# Patient Record
Sex: Female | Born: 1955 | Race: White | Hispanic: No | Marital: Married | Smoking: Former smoker
Health system: Southern US, Community
[De-identification: ages and names within clinical notes are randomized; demographics above are authoritative.]

---

## 2001-02-10 ENCOUNTER — Encounter: Payer: Self-pay | Admitting: Obstetrics and Gynecology

## 2001-02-10 ENCOUNTER — Ambulatory Visit (HOSPITAL_COMMUNITY): Admission: RE | Admit: 2001-02-10 | Discharge: 2001-02-10 | Payer: Self-pay | Admitting: Obstetrics and Gynecology

## 2001-02-17 ENCOUNTER — Other Ambulatory Visit: Admission: RE | Admit: 2001-02-17 | Discharge: 2001-02-17 | Payer: Self-pay | Admitting: Obstetrics and Gynecology

## 2002-02-21 ENCOUNTER — Ambulatory Visit (HOSPITAL_COMMUNITY): Admission: RE | Admit: 2002-02-21 | Discharge: 2002-02-21 | Payer: Self-pay | Admitting: Obstetrics and Gynecology

## 2002-02-21 ENCOUNTER — Encounter: Payer: Self-pay | Admitting: Obstetrics and Gynecology

## 2002-06-06 ENCOUNTER — Inpatient Hospital Stay (HOSPITAL_COMMUNITY): Admission: RE | Admit: 2002-06-06 | Discharge: 2002-06-07 | Payer: Self-pay | Admitting: Obstetrics and Gynecology

## 2003-05-10 ENCOUNTER — Ambulatory Visit (HOSPITAL_COMMUNITY): Admission: RE | Admit: 2003-05-10 | Discharge: 2003-05-10 | Payer: Self-pay | Admitting: Orthopedic Surgery

## 2003-06-19 ENCOUNTER — Encounter: Admission: RE | Admit: 2003-06-19 | Discharge: 2003-06-19 | Payer: Self-pay | Admitting: Orthopedic Surgery

## 2003-06-30 ENCOUNTER — Encounter: Admission: RE | Admit: 2003-06-30 | Discharge: 2003-06-30 | Payer: Self-pay | Admitting: Orthopedic Surgery

## 2003-07-17 ENCOUNTER — Encounter: Admission: RE | Admit: 2003-07-17 | Discharge: 2003-07-17 | Payer: Self-pay | Admitting: Orthopedic Surgery

## 2003-08-30 ENCOUNTER — Ambulatory Visit (HOSPITAL_COMMUNITY): Admission: RE | Admit: 2003-08-30 | Discharge: 2003-08-30 | Payer: Self-pay | Admitting: Obstetrics and Gynecology

## 2003-10-07 ENCOUNTER — Emergency Department (HOSPITAL_COMMUNITY): Admission: EM | Admit: 2003-10-07 | Discharge: 2003-10-07 | Payer: Self-pay | Admitting: Emergency Medicine

## 2005-01-07 ENCOUNTER — Ambulatory Visit (HOSPITAL_COMMUNITY): Admission: RE | Admit: 2005-01-07 | Discharge: 2005-01-07 | Payer: Self-pay | Admitting: Obstetrics and Gynecology

## 2006-06-15 ENCOUNTER — Ambulatory Visit (HOSPITAL_COMMUNITY): Admission: RE | Admit: 2006-06-15 | Discharge: 2006-06-15 | Payer: Self-pay | Admitting: Obstetrics and Gynecology

## 2006-11-20 ENCOUNTER — Ambulatory Visit (HOSPITAL_COMMUNITY): Admission: RE | Admit: 2006-11-20 | Discharge: 2006-11-20 | Payer: Self-pay | Admitting: Urology

## 2008-09-25 IMAGING — CT CT ABDOMEN WO/W CM
3 of 6 series · 12 of 46 positions shown, 17 images · IV contrast (Omnipaque 300)
Comparison: 10/07/2003.

ABDOMEN CT WITHOUT AND WITH CONTRAST

CLINICAL DATA: Microscopic hematuria. Low to mid pelvic pain since 4883. History
of partial hysterectomy.
TECHNIQUE: Multidetector CT imaging of the abdomen and pelvis was performed
following the standard protocol both before and during bolus administration of
intravenous contrast.

Contrast:  100 cc Omnipaque 300

[Series 4: mpr coronal a/p cor · coronal · 0.68mm/px · 3 of 87 slices shown]
[im 29/87  soft-tissue]
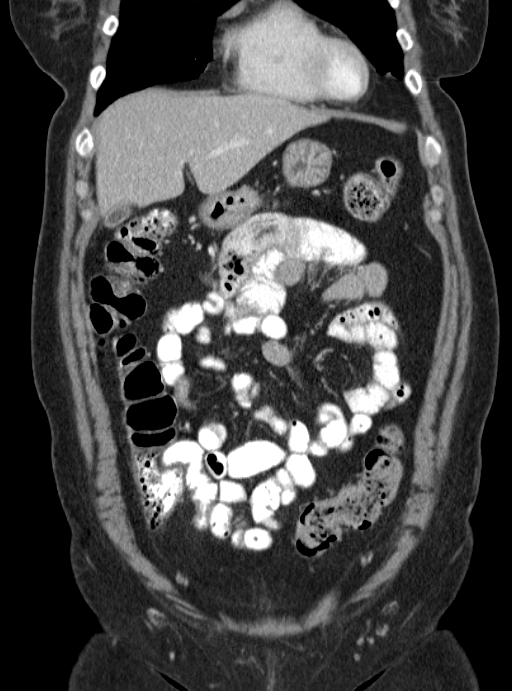
[im 39/87  soft-tissue]
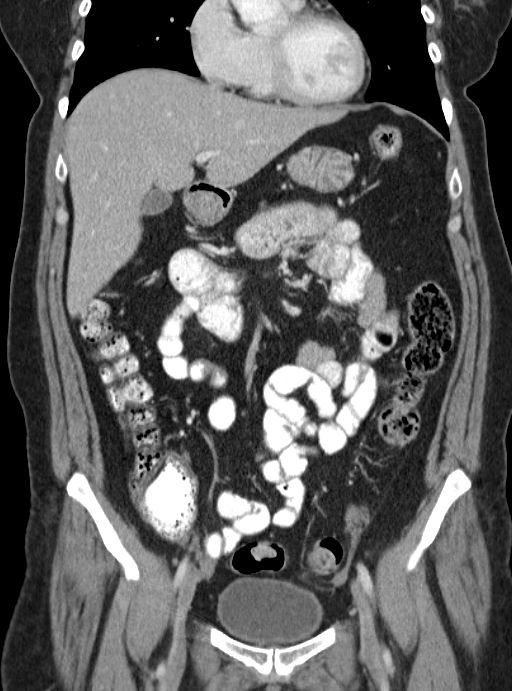
[im 48/87  soft-tissue]
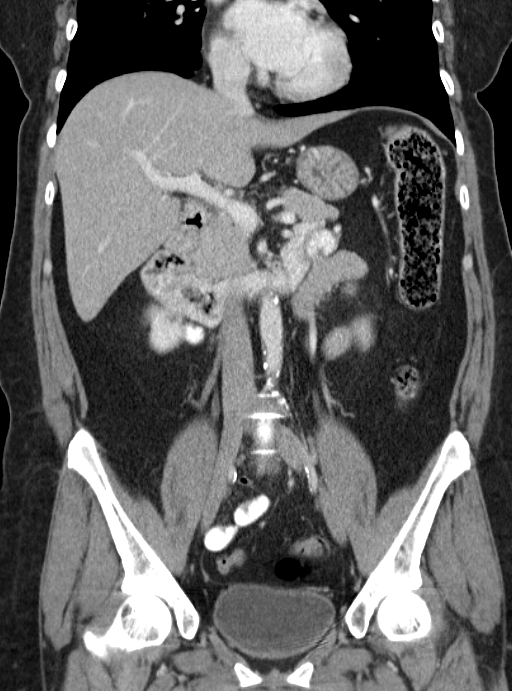

[Series 5: mpr sagittal a/p sag · sagittal · 0.66mm/px · 1 of 112 slices shown]
[im 38/112  soft-tissue]
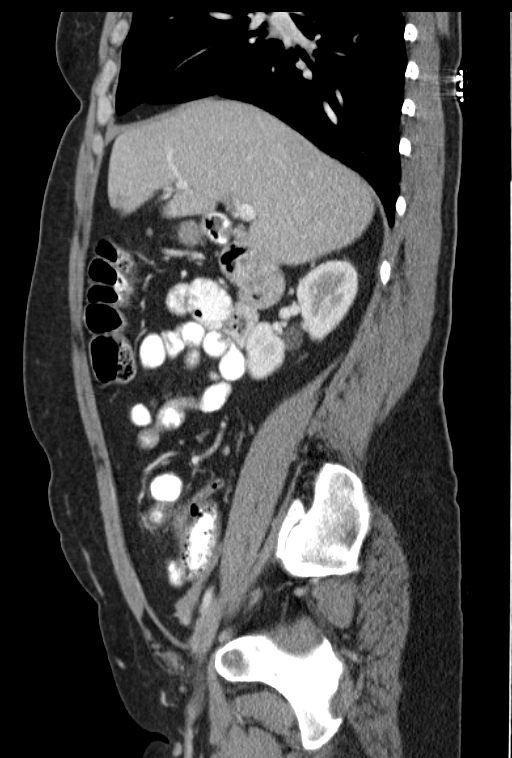

[Series 6: kidney delay 5.0 b40f · axial · delayed · 0.72mm/px · z∈[-426,-40]mm · 8 of 99 slices shown, 13 images]
[im 11/99  soft-tissue]
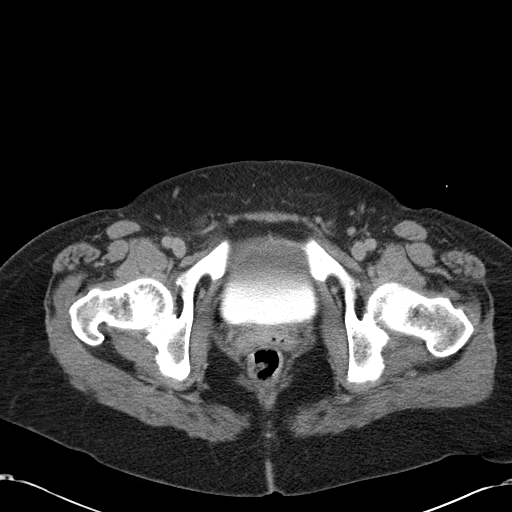
[im 11/99  bone]
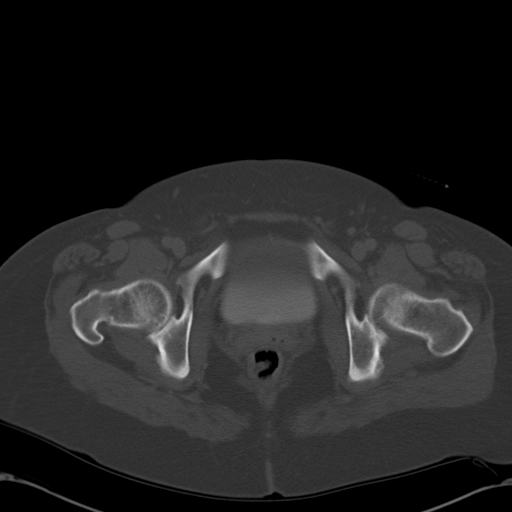
[im 22/99  soft-tissue]
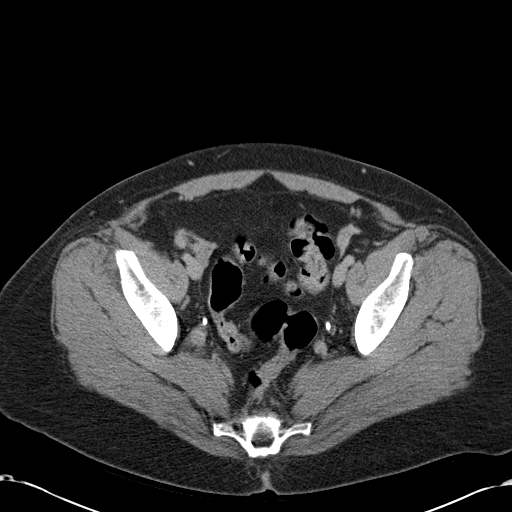
[im 33/99  soft-tissue]
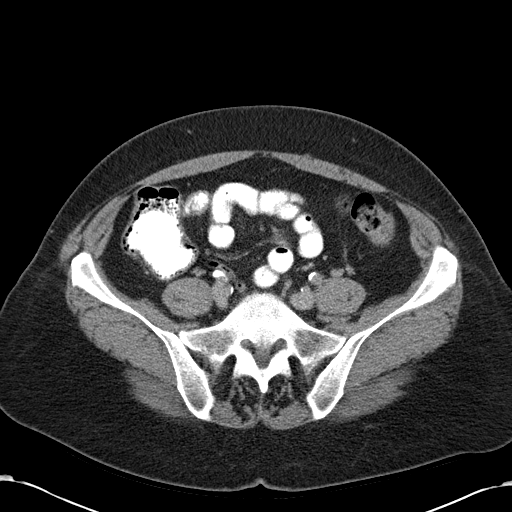
[im 44/99  soft-tissue]
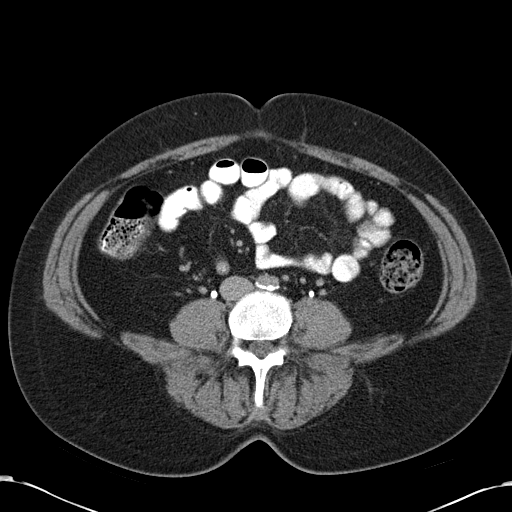
[im 55/99  soft-tissue]
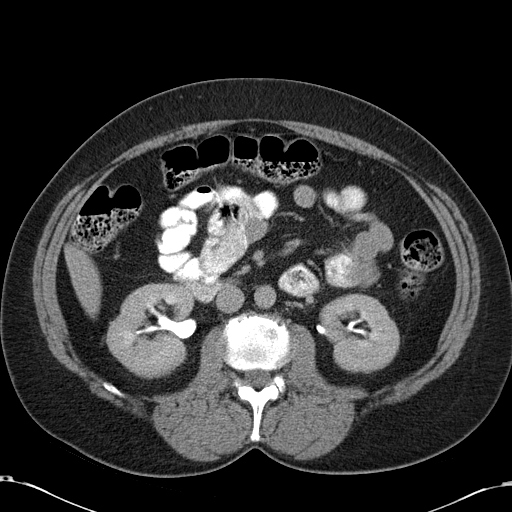
[im 55/99  lung]
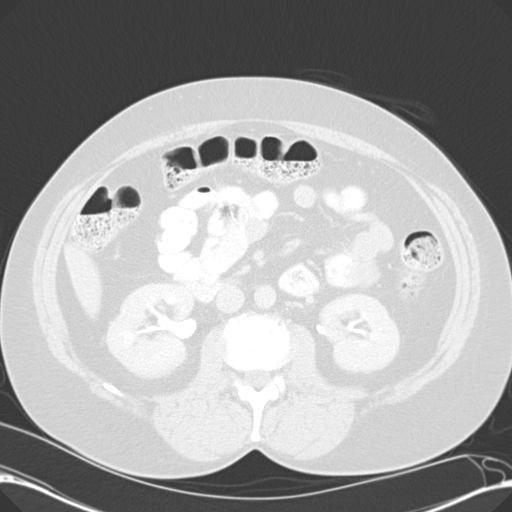
[im 66/99  soft-tissue]
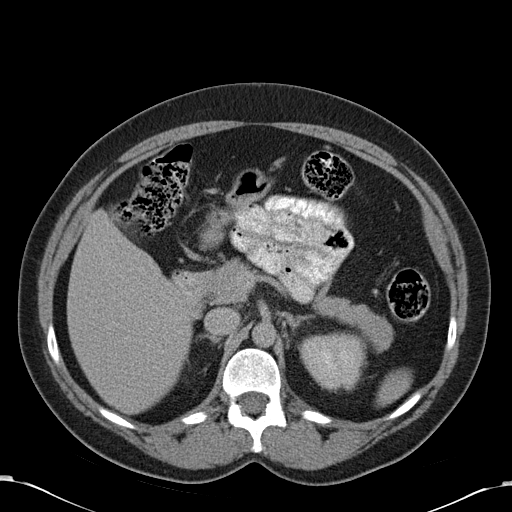
[im 66/99  lung]
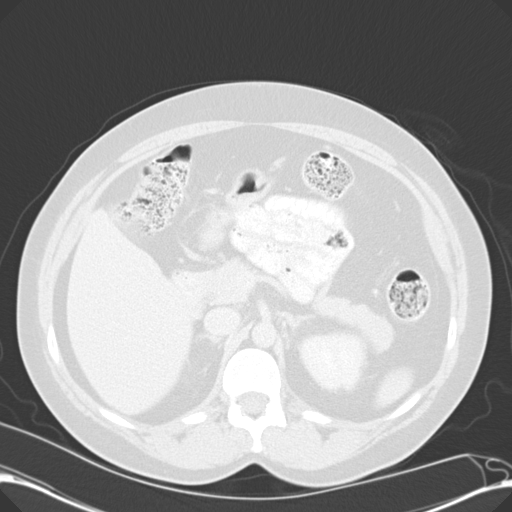
[im 77/99  soft-tissue]
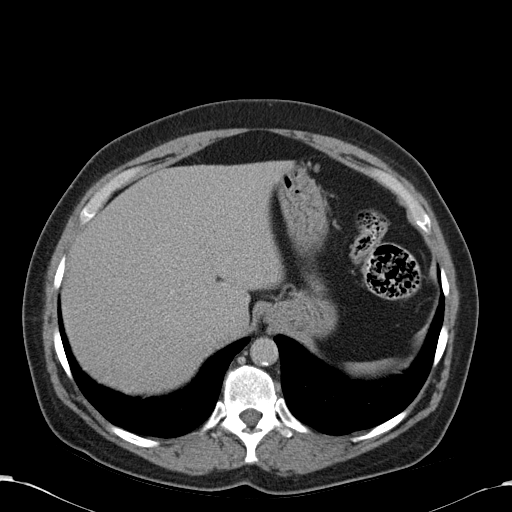
[im 77/99  lung]
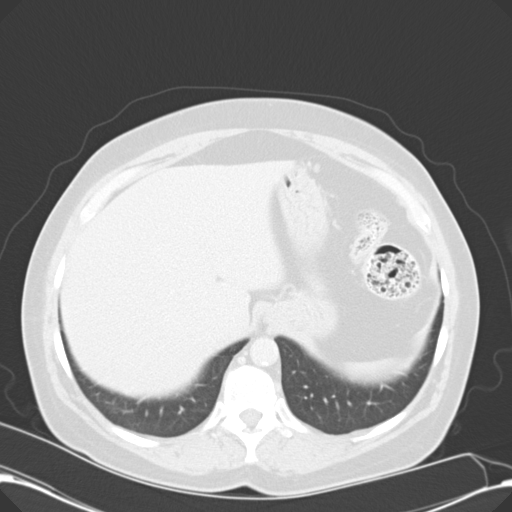
[im 88/99  soft-tissue]
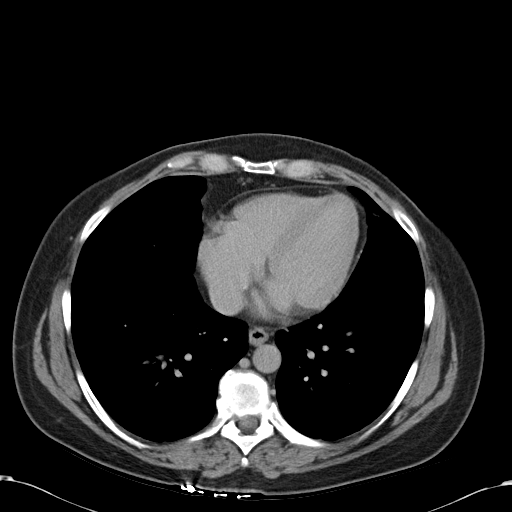
[im 88/99  lung]
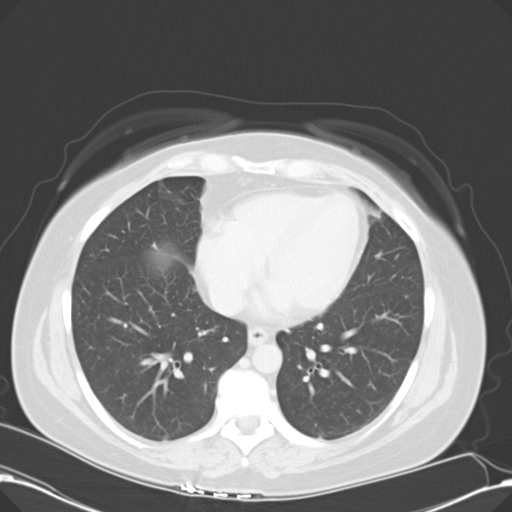

[12 of 46 positions shown; findings below may reference images not displayed]

FINDINGS: Unenhanced images demonstrates right inter/lower pole 5 mm calculus
which is similar to on the prior exam. A upper pole smaller calculus on the
right has passed. Probable 1 mm left upper pole renal calculus on image 35.

Postcontrast images demonstrate subpleural 4-5 mm right middle lobe lung nodule
on image 9 which is not imaged on prior exams. Pleural irregularity versus
subpleural nodule at 2 mm in right lower lobe on image 12.

Normal heart size without pericardial or pleural effusion. Normal liver, spleen,
stomach, pancreas, gallbladder, adrenal glands. Right lower pole too small to
characterize renal lesion is most likely a cyst . mild age advanced aortic
atherosclerosis. No retroperitoneal or retrocrural adenopathy.

Normal colon and appendix. Normal small bowel without ascites.

IMPRESSION

1. Right and likely left nonobstructive renal calculi. A right-sided stone has
passed since 10/07/2003.
[DATE]. Lung nodules up to 5 mm as described. If there is a smoking history, chest CT
followup in approximately 9-12 months should be considered.

PELVIS CT WITHOUT AND WITH CONTRAST
FINDINGS: No distal urinary tract calculi. Pelvic bowel loops are normal. No
pelvic adenopathy or ascites.

Hysterectomy. Normal urinary bladder and vaginal cuff. Ovaries are both
positioned superiorly and anteriorly but there is no adnexal or ovarian mass.
Normal bones.

IMPRESSION

1. Hysterectomy but no acute pelvic process.

## 2010-10-25 NOTE — Discharge Summary (Signed)
   NAME:  Amy Ashley, Amy Ashley                          ACCOUNT NO.:  [id number]   MEDICAL RECORD NO.:  [id number]                   PATIENT TYPE:  INP   LOCATION:  A419                                 FACILITY:  [facility]   PHYSICIAN:  Tilda Burrow, M.D.              DATE OF BIRTH:  02/08/1956   DATE OF ADMISSION:  06/06/2002  DATE OF DISCHARGE:  06/07/2002                                 DISCHARGE SUMMARY   PREOPERATIVE DIAGNOSES:  1. Menometrorrhagia.  2. Dysmenorrhea.   ADMISSION DIAGNOSES:  1. Menometrorrhagia.  2. Dysmenorrhea.   POSTOPERATIVE DIAGNOSES:  1. Menometrorrhagia.  2. Dysmenorrhea.   PROCEDURE:  Vaginal hysterectomy on 06/06/02.   DISCHARGE MEDICATIONS:  1. Tylox one to two q.4h. p.r.n. pain, dispensed 20.  2. Laxative of choice p.o. daily for two weeks.  3. Diazepam 10 mg one p.o. daily in partial doses.  4. Doxycycline 100 mg b.i.d. for seven days.   HISTORY OF PRESENT ILLNESS:  This 55 year old female status post tubal  ligation, gravida 2, para 2, was admitted for vaginal hysterectomy and was  counseled as described in the admitting history; see the record for details.  Blood type was O+. Quantitative HCG negative. Hemoglobin was 12.7,  hematocrit 37.2, white count 6,400. Electrolytes normal with BUN 14,  creatinine 0.7. The patient was operated on with a 150 mL blood loss on  06/06/02. Vaginal pack was removed at 8:30 a.m. with hemoglobin 11.9 and  hematocrit 34.9. The patient was tolerating her status well and was  discharged home with Tylox x30 tablets and doxycycline 100 mg b.i.d. for  seven days.   DISPOSITION:  Followup will be in four weeks in our office. Pathology report  has returned showing a 160 g uterus with benign secretory endometrium and no  evidence of precancerous lesions or endometrial abnormalities.                                               Tilda Burrow, M.D.    JVF/MEDQ  D:  07/02/2002  T:  07/04/2002  Job:  [id number]

## 2010-10-25 NOTE — Op Note (Signed)
   NAME:  Amy Ashley, Amy Ashley                          ACCOUNT NO.:  [id number]   MEDICAL RECORD NO.:  [id number]                   PATIENT TYPE:  INP   LOCATION:  A419                                 FACILITY:  [facility]   PHYSICIAN:  Tilda Burrow, M.D.              DATE OF BIRTH:  03-01-1956   DATE OF PROCEDURE:  DATE OF DISCHARGE:  06/07/2002                                 OPERATIVE REPORT   PREOPERATIVE DIAGNOSES:  Menometrorrhagia, dysmenorrhea.   POSTOPERATIVE DIAGNOSES:  Menometrorrhagia, dysmenorrhea.   PROCEDURE:  Vaginal hysterectomy.   SURGEON:  Tilda Burrow, M.D.   ASSISTANT:  Amie Critchley and [place].   ANESTHESIA:  General.   COMPLICATIONS:  None.   ESTIMATED BLOOD LOSS:  150 mL   INDICATIONS:  A 55 year old gravida 2, status post tubal ligation, admitted  for hysterectomy as described in the admitting history.   PAST MEDICAL HISTORY:  Please refer to admitting history.   DETAILS OF PROCEDURE:  The patient was taken to the operating room and  prepped and draped for a vaginal procedure with the legs on candy cane leg  supports.  Posterior colpotomy incisions performed behind the uterus and the  3-inch weighted speculum placed in the was placed the vagina.  Lateral  retraction using Deaver vaginal retractors were performed.  The anterior cervicovaginal fornix was opened and the bladder elevated. The  anterior peritoneum could not be entered in.  The uterosacral ligaments were  then clamped, cut, and suture ligated on each side, and then the lower  cardinal ligaments clamped, cut, and suture ligated on either side. The  anterior vesicouterine peritoneum could then be identified and entered. Then  we marched up the broad ligament on either side using serial bites with  Zeppelin clamps, Mayo scissor transection and #0 chromic suture ligature.  Upon reaching the level of the utero-ovarian ligament and the fallopian tube  the Zeppelin clamps were able to cross clamp the  pedicle with transection  and #0  chromic suture ligature.  The patient tolerated the procedure well.  We then  closed the peritoneum after hemostasis was confirmed using pursestring  suture of 2-0 chromic followed by midline closure of the vaginal cuff with  good surgical results.  The patient tolerated the procedure well and went to  recovery room in good condition.                                               Tilda Burrow, M.D.    JVF/MEDQ  D:  07/02/2002  T:  07/03/2002  Job:  [id number]

## 2010-10-25 NOTE — Op Note (Signed)
NAME:  Amy Ashley, Amy Ashley                          ACCOUNT NO.:  [id number]   MEDICAL RECORD NO.:  [id number]                   PATIENT TYPE:  AMB   LOCATION:  DAY                                  FACILITY:  [facility]   PHYSICIAN:  Tilda Burrow, M.D.              DATE OF BIRTH:  1955/12/19   DATE OF PROCEDURE:  06/06/2002  DATE OF DISCHARGE:                                 OPERATIVE REPORT   PREOPERATIVE DIAGNOSES:  1. Menometrorrhagia.  2. Dysmenorrhea.   POSTOPERATIVE DIAGNOSES:  1. Menometrorrhagia.  2. Dysmenorrhea.   PROCEDURE:  Vaginal hysterectomy.   SURGEON:  Tilda Burrow, M.D.   ASSISTANTMarlinda Mike, R.N., and [place], C.S.T.   ANESTHESIA:  General, [place], C.R.N.A.   COMPLICATIONS:  None.   FINDINGS:  Twice normal-size uterus, anteflexed uterus, excellent pelvic  support.  Normal-appearing ovaries without visible adhesions.  Short utero-  ovarian ligaments.   DESCRIPTION OF PROCEDURE:  The patient was taken to the operating room,  prepped and draped for a vaginal procedure with hips supported in candy cane  stirrups.  The anterior fornix was opened after infiltration with Xylocaine  with epinephrine and dissection out to beneath the bladder was performed.  We could not quite reach the peritoneum, so posterior colpotomy incision was  then performed.   With a three-inch weighted speculum in the vagina, we made a colpotomy  incision, identified the uterosacral ligaments on each side, and  crossclamped them with curved Zeppelin clamps, transected them, and then  ligated with 0 chromic.  These uterosacral ligament pedicles were tagged.  A  long weighted speculum was placed in the posterior vagina, extending up into  the peritoneal cavity.  The lower cardinal ligaments were then clamped, cut,  and suture ligated using 0 chromic.  Upper cardinal ligaments were then  clamped, cut, and suture ligated similarly.  At this point we could identify  the anterior  peritoneum, and the anterior colpotomy incision was performed  as well in the vesicouterine reflection of peritoneum.  We then marched up  the side of the broad ligament, clamping with Zeppelin clamp, transecting,  and then 0 chromic suture ligating.  We marched up close to the uterine  vessels on each side.  Hemostasis was quite good through the majority of the  case.  There was technical difficulty due to the greater than expected  uterine length, so a morcellation procedure was performed by amputating off  the cervix and lower uterine segment.  This allowed [place] to march up the upper  aspects of the broad ligament in serial bites using Zeppelin clamps and 0  chromic ligatures.  Upon reaching the utero-ovarian ligaments, we were able  to crossclamp this and incorporate it into the ligatures.  We were then able  to crossclamp the round ligament on each side and take out the uterus.  There was some oozing from the area of the  uterosacral ligaments on the left  side, and this required two additional sutures in order to achieve adequate  hemostasis using 0 chromic.  Inspection of the pedicles then revealed  adequate hemostasis.  The clipped peritoneum was closed in a pursestring  fashion, tied down, and the knot broke.  We had to reclose the peritoneum  using interrupted sutures anterior to posterior.  This was successfully  completed.  We then closed the cuff in the midline, pulling interrupted 2-0  chromic sutures from each side, resulting in good support.  The uterosacral  ligament pedicles were tied together in the midline.  The patient tolerated  the procedure well.  Vaginal packing was placed, followed by Foley catheter  insertion, which revealed clear urine with 150 cc measured in the recovery  room.                                                Tilda Burrow, M.D.    JVF/MEDQ  D:  06/06/2002  T:  06/06/2002  Job:  [id number]

## 2010-10-25 NOTE — H&P (Signed)
NAMEMiya, Amy Ashley NO.:  [id number]   MEDICAL RECORD NO.:  [id number]                  PATIENT TYPE:   LOCATION:                                       FACILITY:   PHYSICIAN:  Tilda Burrow, M.D.              DATE OF BIRTH:  November 26, 1955   DATE OF ADMISSION:  06/06/2002  DATE OF DISCHARGE:                                HISTORY & PHYSICAL   ADMISSION DIAGNOSIS:  Menometorrhagia with failed medical therapy.   HISTORY OF PRESENT ILLNESS:  This 55 year old female, G2, P2, status post  tubal ligation is admitted at this time for vaginal hysterectomy.  The  patient has been seen in our office.  She has a 10- to 14-day period of  cramping in back and discomfort throughout.  She noted six days of  premenstrual pelvic discomfort as well.  She has some mild urinary frequency  with only one episode of nocturia per night.  She denies stress or  incontinence.  Bowel movement function is normal.  The patient smokes one to  two packs per day which precludes the use of oral contraceptives.  Endometrial biopsy has been performed which showed benign endometrial  tissues.  She was considered for endometrial ablation with the pelvic  discomfort and pain and was considered excessive.  Pros and cons of  hysterectomy have been discussed and the patient is strongly supportive in  proceeding towards hysterectomy which should be able to be performed  vaginally.  The patient is aware of all the usual risks of surgery that can  occur including risk of bleeding, infection and injury to adjacent organs.  Vaginal hysterectomy specifically may require conversion to abdominal  procedure for complications or unsuspected findings.  Plans are for ovarian  preservation.   PAST MEDICAL HISTORY:  1. Cigarette use x15 years with two packs per day.  2. Mild anxiety disorder easily controlled.   PAST SURGICAL HISTORY:  Tubal ligation in 1983.   MEDICATIONS:  Diazepam 10 mg one  per month in partial doses.   PAST OBSTETRICAL HISTORY:  G2, P2.  Pap smear x1 every other year recently.   SOCIAL HISTORY:  Occasional alcohol on the weekends.  Denies drug use.   PHYSICAL EXAMINATION:  GENERAL:  Healthy-appearing, Caucasian female who  appears her stated age.  HEENT:  Pupils equal round and reactive to light.  Extraocular movements  intact.  NECK:  Supple.  Trachea midline.  CHEST:  Clear to auscultation.  BREASTS:  Deferred.  CARDIAC:  Regular rate and rhythm.  ABDOMEN:  Slim, tanned without masses.  GENITALIA:  External genitalia normal.  Vaginal length 4-5 inches, cervix  bulbous.  Uterus anteflexed, normal size, shape and contour.  Adnexa  negative for masses or tenderness.   IMPRESSION:  1. Menometrorrhagia.  2. Dysmenorrhea.   PLAN:  Vaginal hysterectomy with preservation of ovaries on June 06, 2002.  Tilda Burrow, M.D.    JVF/MEDQ  D:  05/24/2002  T:  05/24/2002  Job:  [id number]

## 2017-07-14 DIAGNOSIS — I10 Essential (primary) hypertension: Secondary | ICD-10-CM | POA: Diagnosis not present

## 2017-07-14 DIAGNOSIS — F411 Generalized anxiety disorder: Secondary | ICD-10-CM | POA: Diagnosis not present

## 2017-07-14 DIAGNOSIS — Z6830 Body mass index (BMI) 30.0-30.9, adult: Secondary | ICD-10-CM | POA: Diagnosis not present

## 2017-07-14 DIAGNOSIS — E7849 Other hyperlipidemia: Secondary | ICD-10-CM | POA: Diagnosis not present

## 2017-10-15 DIAGNOSIS — E7849 Other hyperlipidemia: Secondary | ICD-10-CM | POA: Diagnosis not present

## 2017-10-15 DIAGNOSIS — F411 Generalized anxiety disorder: Secondary | ICD-10-CM | POA: Diagnosis not present

## 2017-10-15 DIAGNOSIS — Z6830 Body mass index (BMI) 30.0-30.9, adult: Secondary | ICD-10-CM | POA: Diagnosis not present

## 2017-10-15 DIAGNOSIS — I10 Essential (primary) hypertension: Secondary | ICD-10-CM | POA: Diagnosis not present

## 2018-01-18 DIAGNOSIS — Z6831 Body mass index (BMI) 31.0-31.9, adult: Secondary | ICD-10-CM | POA: Diagnosis not present

## 2018-01-18 DIAGNOSIS — Z6830 Body mass index (BMI) 30.0-30.9, adult: Secondary | ICD-10-CM | POA: Diagnosis not present

## 2018-01-18 DIAGNOSIS — I10 Essential (primary) hypertension: Secondary | ICD-10-CM | POA: Diagnosis not present

## 2018-01-18 DIAGNOSIS — Z0000 Encounter for general adult medical examination without abnormal findings: Secondary | ICD-10-CM | POA: Diagnosis not present

## 2018-01-18 DIAGNOSIS — E7849 Other hyperlipidemia: Secondary | ICD-10-CM | POA: Diagnosis not present

## 2018-01-18 DIAGNOSIS — F411 Generalized anxiety disorder: Secondary | ICD-10-CM | POA: Diagnosis not present

## 2018-02-05 DIAGNOSIS — Z1231 Encounter for screening mammogram for malignant neoplasm of breast: Secondary | ICD-10-CM | POA: Diagnosis not present

## 2018-02-22 DIAGNOSIS — M810 Age-related osteoporosis without current pathological fracture: Secondary | ICD-10-CM | POA: Diagnosis not present

## 2018-02-22 DIAGNOSIS — E2839 Other primary ovarian failure: Secondary | ICD-10-CM | POA: Diagnosis not present

## 2018-04-21 DIAGNOSIS — Z6831 Body mass index (BMI) 31.0-31.9, adult: Secondary | ICD-10-CM | POA: Diagnosis not present

## 2018-04-21 DIAGNOSIS — E7849 Other hyperlipidemia: Secondary | ICD-10-CM | POA: Diagnosis not present

## 2018-04-21 DIAGNOSIS — I10 Essential (primary) hypertension: Secondary | ICD-10-CM | POA: Diagnosis not present

## 2018-04-21 DIAGNOSIS — F418 Other specified anxiety disorders: Secondary | ICD-10-CM | POA: Diagnosis not present

## 2018-07-22 DIAGNOSIS — F419 Anxiety disorder, unspecified: Secondary | ICD-10-CM | POA: Diagnosis not present

## 2018-07-22 DIAGNOSIS — I10 Essential (primary) hypertension: Secondary | ICD-10-CM | POA: Diagnosis not present

## 2018-07-22 DIAGNOSIS — E7849 Other hyperlipidemia: Secondary | ICD-10-CM | POA: Diagnosis not present

## 2019-01-25 DIAGNOSIS — Z0000 Encounter for general adult medical examination without abnormal findings: Secondary | ICD-10-CM | POA: Diagnosis not present

## 2019-01-25 DIAGNOSIS — Z6829 Body mass index (BMI) 29.0-29.9, adult: Secondary | ICD-10-CM | POA: Diagnosis not present

## 2019-04-22 DIAGNOSIS — Z1231 Encounter for screening mammogram for malignant neoplasm of breast: Secondary | ICD-10-CM | POA: Diagnosis not present

## 2019-05-04 DIAGNOSIS — E7849 Other hyperlipidemia: Secondary | ICD-10-CM | POA: Diagnosis not present

## 2019-05-04 DIAGNOSIS — F419 Anxiety disorder, unspecified: Secondary | ICD-10-CM | POA: Diagnosis not present

## 2019-05-04 DIAGNOSIS — I10 Essential (primary) hypertension: Secondary | ICD-10-CM | POA: Diagnosis not present

## 2019-05-04 DIAGNOSIS — Z6828 Body mass index (BMI) 28.0-28.9, adult: Secondary | ICD-10-CM | POA: Diagnosis not present

## 2019-08-09 DIAGNOSIS — Z6829 Body mass index (BMI) 29.0-29.9, adult: Secondary | ICD-10-CM | POA: Diagnosis not present

## 2019-08-09 DIAGNOSIS — E7849 Other hyperlipidemia: Secondary | ICD-10-CM | POA: Diagnosis not present

## 2019-08-09 DIAGNOSIS — I10 Essential (primary) hypertension: Secondary | ICD-10-CM | POA: Diagnosis not present

## 2019-08-09 DIAGNOSIS — F419 Anxiety disorder, unspecified: Secondary | ICD-10-CM | POA: Diagnosis not present

## 2019-09-01 DIAGNOSIS — Z23 Encounter for immunization: Secondary | ICD-10-CM | POA: Diagnosis not present

## 2019-09-24 DIAGNOSIS — Z23 Encounter for immunization: Secondary | ICD-10-CM | POA: Diagnosis not present

## 2019-11-09 DIAGNOSIS — I10 Essential (primary) hypertension: Secondary | ICD-10-CM | POA: Diagnosis not present

## 2019-11-09 DIAGNOSIS — F419 Anxiety disorder, unspecified: Secondary | ICD-10-CM | POA: Diagnosis not present

## 2019-11-09 DIAGNOSIS — Z6830 Body mass index (BMI) 30.0-30.9, adult: Secondary | ICD-10-CM | POA: Diagnosis not present

## 2019-11-09 DIAGNOSIS — E7849 Other hyperlipidemia: Secondary | ICD-10-CM | POA: Diagnosis not present

## 2020-02-09 DIAGNOSIS — I10 Essential (primary) hypertension: Secondary | ICD-10-CM | POA: Diagnosis not present

## 2020-02-09 DIAGNOSIS — Z6830 Body mass index (BMI) 30.0-30.9, adult: Secondary | ICD-10-CM | POA: Diagnosis not present

## 2020-02-09 DIAGNOSIS — F419 Anxiety disorder, unspecified: Secondary | ICD-10-CM | POA: Diagnosis not present

## 2020-02-09 DIAGNOSIS — E7849 Other hyperlipidemia: Secondary | ICD-10-CM | POA: Diagnosis not present

## 2020-02-09 DIAGNOSIS — Z0000 Encounter for general adult medical examination without abnormal findings: Secondary | ICD-10-CM | POA: Diagnosis not present

## 2020-05-15 DIAGNOSIS — E7849 Other hyperlipidemia: Secondary | ICD-10-CM | POA: Diagnosis not present

## 2020-05-15 DIAGNOSIS — M25511 Pain in right shoulder: Secondary | ICD-10-CM | POA: Diagnosis not present

## 2020-05-15 DIAGNOSIS — I10 Essential (primary) hypertension: Secondary | ICD-10-CM | POA: Diagnosis not present

## 2020-05-15 DIAGNOSIS — F419 Anxiety disorder, unspecified: Secondary | ICD-10-CM | POA: Diagnosis not present

## 2020-05-15 DIAGNOSIS — Z0000 Encounter for general adult medical examination without abnormal findings: Secondary | ICD-10-CM | POA: Diagnosis not present

## 2020-08-06 DIAGNOSIS — E7849 Other hyperlipidemia: Secondary | ICD-10-CM | POA: Diagnosis not present

## 2020-08-06 DIAGNOSIS — F419 Anxiety disorder, unspecified: Secondary | ICD-10-CM | POA: Diagnosis not present

## 2020-08-06 DIAGNOSIS — I10 Essential (primary) hypertension: Secondary | ICD-10-CM | POA: Diagnosis not present

## 2020-08-06 DIAGNOSIS — M25511 Pain in right shoulder: Secondary | ICD-10-CM | POA: Diagnosis not present

## 2020-11-07 DIAGNOSIS — F419 Anxiety disorder, unspecified: Secondary | ICD-10-CM | POA: Diagnosis not present

## 2020-11-07 DIAGNOSIS — R5383 Other fatigue: Secondary | ICD-10-CM | POA: Diagnosis not present

## 2020-11-07 DIAGNOSIS — I10 Essential (primary) hypertension: Secondary | ICD-10-CM | POA: Diagnosis not present

## 2020-11-07 DIAGNOSIS — E7849 Other hyperlipidemia: Secondary | ICD-10-CM | POA: Diagnosis not present

## 2021-02-07 DIAGNOSIS — F419 Anxiety disorder, unspecified: Secondary | ICD-10-CM | POA: Diagnosis not present

## 2021-02-07 DIAGNOSIS — E7849 Other hyperlipidemia: Secondary | ICD-10-CM | POA: Diagnosis not present

## 2021-02-07 DIAGNOSIS — Z6831 Body mass index (BMI) 31.0-31.9, adult: Secondary | ICD-10-CM | POA: Diagnosis not present

## 2021-02-07 DIAGNOSIS — I10 Essential (primary) hypertension: Secondary | ICD-10-CM | POA: Diagnosis not present

## 2021-02-14 ENCOUNTER — Encounter (INDEPENDENT_AMBULATORY_CARE_PROVIDER_SITE_OTHER): Payer: Self-pay | Admitting: *Deleted

## 2021-05-16 DIAGNOSIS — Z6830 Body mass index (BMI) 30.0-30.9, adult: Secondary | ICD-10-CM | POA: Diagnosis not present

## 2021-05-16 DIAGNOSIS — Z0000 Encounter for general adult medical examination without abnormal findings: Secondary | ICD-10-CM | POA: Diagnosis not present

## 2021-06-20 ENCOUNTER — Telehealth (INDEPENDENT_AMBULATORY_CARE_PROVIDER_SITE_OTHER): Payer: Self-pay | Admitting: Gastroenterology

## 2021-06-20 NOTE — Telephone Encounter (Signed)
Patient called the office stated she has been waiting sense September 2022 to get her colonoscopy scheduled - states she has turned in her paperwork - please advise - ph# [phone]

## 2021-06-27 ENCOUNTER — Other Ambulatory Visit (INDEPENDENT_AMBULATORY_CARE_PROVIDER_SITE_OTHER): Payer: Self-pay

## 2021-06-27 ENCOUNTER — Encounter (INDEPENDENT_AMBULATORY_CARE_PROVIDER_SITE_OTHER): Payer: Self-pay | Admitting: *Deleted

## 2021-06-27 ENCOUNTER — Encounter (INDEPENDENT_AMBULATORY_CARE_PROVIDER_SITE_OTHER): Payer: Self-pay

## 2021-06-27 ENCOUNTER — Telehealth (INDEPENDENT_AMBULATORY_CARE_PROVIDER_SITE_OTHER): Payer: Self-pay

## 2021-06-27 DIAGNOSIS — Z1211 Encounter for screening for malignant neoplasm of colon: Secondary | ICD-10-CM

## 2021-06-27 MED ORDER — PEG 3350-KCL-NA BICARB-NACL 420 G PO SOLR: 4000.0000 mL | ORAL | 0 refills | Status: DC

## 2021-06-27 NOTE — Patient Instructions (Signed)
Amy Ashley  06/27/2021     @PREFPERIOPPHARMACY @   Your procedure is scheduled on  07/03/2021.   Report to [facility] at  0900 A.M.   Call this number if you have problems the morning of surgery:  [phone]   Remember:  Follow the diet and prep instructions given to you by the office.    Take these medicines the morning of surgery with A SIP OF WATER                             metoprolol.     Do not wear jewelry, make-up or nail polish.  Do not wear lotions, powders, or perfumes, or deodorant.  Do not shave 48 hours prior to surgery.  Men may shave face and neck.  Do not bring valuables to the hospital.  [facility] is not responsible for any belongings or valuables.  Contacts, dentures or bridgework may not be worn into surgery.  Leave your suitcase in the car.  After surgery it may be brought to your room.  For patients admitted to the hospital, discharge time will be determined by your treatment team.  Patients discharged the day of surgery will not be allowed to drive home and must have someone with them for 24 hours.    Special instructions:   DO NOT smoke tobacco or vape for 24 hours before your procedure.  Please read over the following fact sheets that you were given. Anesthesia Post-op Instructions and Care and Recovery After Surgery      Monitored Anesthesia Care, Care After This sheet gives you information about how to care for yourself after your procedure. Your health care provider may also give you more specific instructions. If you have problems or questions, contact your health care provider. What can I expect after the procedure? After the procedure, it is common to have: Tiredness. Forgetfulness about what happened after the procedure. Impaired judgment for important decisions. Nausea or vomiting. Some difficulty with balance. Follow these instructions at home: For the time period you were told by your health care provider:    Rest as needed. Do not participate in activities where you could fall or become injured. Do not drive or use machinery. Do not drink alcohol. Do not take sleeping pills or medicines that cause drowsiness. Do not make important decisions or sign legal documents. Do not take care of children on your own. Eating and drinking Follow the diet that is recommended by your health care provider. Drink enough fluid to keep your urine pale yellow. If you vomit: Drink water, juice, or soup when you can drink without vomiting. Make sure you have little or no nausea before eating solid foods. General instructions Have a responsible adult stay with you for the time you are told. It is important to have someone help care for you until you are awake and alert. Take over-the-counter and prescription medicines only as told by your health care provider. If you have sleep apnea, surgery and certain medicines can increase your risk for breathing problems. Follow instructions from your health care provider about wearing your sleep device: Anytime you are sleeping, including during daytime naps. While taking prescription pain medicines, sleeping medicines, or medicines that make you drowsy. Avoid smoking. Keep all follow-up visits as told by your health care provider. This is important. Contact a health care provider if: You keep feeling nauseous or you keep vomiting.  You feel light-headed. You are still sleepy or having trouble with balance after 24 hours. You develop a rash. You have a fever. You have redness or swelling around the IV site. Get help right away if: You have trouble breathing. You have new-onset confusion at home. Summary For several hours after your procedure, you may feel tired. You may also be forgetful and have poor judgment. Have a responsible adult stay with you for the time you are told. It is important to have someone help care for you until you are awake and alert. Rest as told.  Do not drive or operate machinery. Do not drink alcohol or take sleeping pills. Get help right away if you have trouble breathing, or if you suddenly become confused. This information is not intended to replace advice given to you by your health care provider. Make sure you discuss any questions you have with your health care provider. Document Revised: 02/09/2020 Document Reviewed: 04/28/2019 Elsevier Patient Education  2022 [place]. Colonoscopy, Adult, Care After This sheet gives you information about how to care for yourself after your procedure. Your health care provider may also give you more specific instructions. If you have problems or questions, contact your health care provider. What can I expect after the procedure? After the procedure, it is common to have: A small amount of blood in your stool for 24 hours after the procedure. Some gas. Mild cramping or bloating of your abdomen. Follow these instructions at home: Eating and drinking  Drink enough fluid to keep your urine pale yellow. Follow instructions from your health care provider about eating or drinking restrictions. Resume your normal diet as instructed by your health care provider. Avoid heavy or fried foods that are hard to digest. Activity Rest as told by your health care provider. Avoid sitting for a long time without moving. Get up to take short walks every 1-2 hours. This is important to improve blood flow and breathing. Ask for help if you feel weak or unsteady. Return to your normal activities as told by your health care provider. Ask your health care provider what activities are safe for you. Managing cramping and bloating  Try walking around when you have cramps or feel bloated. Apply heat to your abdomen as told by your health care provider. Use the heat source that your health care provider recommends, such as a moist heat pack or a heating pad. Place a towel between your skin and the heat source. Leave  the heat on for 20-30 minutes. Remove the heat if your skin turns bright red. This is especially important if you are unable to feel pain, heat, or cold. You may have a greater risk of getting burned. General instructions If you were given a sedative during the procedure, it can affect you for several hours. Do not drive or operate machinery until your health care provider says that it is safe. For the first 24 hours after the procedure: Do not sign important documents. Do not drink alcohol. Do your regular daily activities at a slower pace than normal. Eat soft foods that are easy to digest. Take over-the-counter and prescription medicines only as told by your health care provider. Keep all follow-up visits as told by your health care provider. This is important. Contact a health care provider if: You have blood in your stool 2-3 days after the procedure. Get help right away if you have: More than a small spotting of blood in your stool. Large blood clots in your  stool. Swelling of your abdomen. Nausea or vomiting. A fever. Increasing pain in your abdomen that is not relieved with medicine. Summary After the procedure, it is common to have a small amount of blood in your stool. You may also have mild cramping and bloating of your abdomen. If you were given a sedative during the procedure, it can affect you for several hours. Do not drive or operate machinery until your health care provider says that it is safe. Get help right away if you have a lot of blood in your stool, nausea or vomiting, a fever, or increased pain in your abdomen. This information is not intended to replace advice given to you by your health care provider. Make sure you discuss any questions you have with your health care provider. Document Revised: 04/01/2019 Document Reviewed: 12/20/2018 Elsevier Patient Education  [place].

## 2021-06-27 NOTE — Telephone Encounter (Signed)
Arloa Prak Ann Advith Martine, CMA  ?

## 2021-06-27 NOTE — Telephone Encounter (Signed)
Referring MD/PCP: Hasanaj  Procedure: tcs  Reason/Indication:  Screening  Has patient had this procedure before?  yes  If so, when, by whom and where? 2012    Is there a family history of colon cancer?  no  Who?  What age when diagnosed?    Is patient diabetic? If yes, Type 1 or Type 2   no      Does patient have prosthetic heart valve or mechanical valve?  no  Do you have a pacemaker/defibrillator?  no  Has patient ever had endocarditis/atrial fibrillation? no  Does patient use oxygen? no  Has patient had joint replacement within last 12 months?  no  Is patient constipated or do they take laxatives? no  Does patient have a history of alcohol/drug use?  no  Have you had a stroke/heart attack last 6 mths? no  Do you take medicine for weight loss?  no  For female patients,: have you had a hysterectomy or are you post menopausal and do you still have your menstrual cycle? no  Is patient on blood thinner such as Coumadin, Plavix and/or Aspirin? no  Medications: triamterenc/HCTZ 37.5/25 MG DAILY, metoprolol 100 mg daily, atorvastatin 40 mg daily, potassium 10 meq daily  Allergies: nkda  Medication Adjustment per Dr Laural Golden  none  Procedure date & time: Tuesday 07/03/21 at 10:30 am

## 2021-06-28 ENCOUNTER — Encounter (HOSPITAL_COMMUNITY)
Admission: RE | Admit: 2021-06-28 | Discharge: 2021-06-28 | Disposition: A | Discharge status: Home / Self Care | Payer: BC Managed Care – PPO | Source: Ambulatory Visit | Attending: Internal Medicine | Admitting: Internal Medicine

## 2021-06-28 ENCOUNTER — Encounter (HOSPITAL_COMMUNITY): Payer: Self-pay | Admitting: *Deleted

## 2021-06-28 DIAGNOSIS — Z1211 Encounter for screening for malignant neoplasm of colon: Secondary | ICD-10-CM | POA: Insufficient documentation

## 2021-06-28 DIAGNOSIS — Z01818 Encounter for other preprocedural examination: Secondary | ICD-10-CM | POA: Insufficient documentation

## 2021-06-28 LAB — BASIC METABOLIC PANEL
Anion gap: 12 (ref 5–15)
BUN: 14 mg/dL (ref 8–23)
CO2: 26 mmol/L (ref 22–32)
Calcium: 9.4 mg/dL (ref 8.9–10.3)
Chloride: 98 mmol/L (ref 98–111)
Creatinine, Ser: 0.65 mg/dL (ref 0.44–1.00)
GFR, Estimated: >60 mL/min (ref >60)
Glucose, Bld: 92 mg/dL (ref 70–99)
Potassium: 3.7 mmol/L (ref 3.5–5.1)
Sodium: 136 mmol/L (ref 135–145)

## 2021-07-03 ENCOUNTER — Ambulatory Visit (HOSPITAL_COMMUNITY): Payer: BC Managed Care – PPO | Admitting: Anesthesiology

## 2021-07-03 ENCOUNTER — Other Ambulatory Visit: Payer: Self-pay

## 2021-07-03 ENCOUNTER — Encounter (INDEPENDENT_AMBULATORY_CARE_PROVIDER_SITE_OTHER): Payer: Self-pay | Admitting: *Deleted

## 2021-07-03 ENCOUNTER — Encounter (HOSPITAL_COMMUNITY): Payer: Self-pay | Admitting: Internal Medicine

## 2021-07-03 ENCOUNTER — Encounter (HOSPITAL_COMMUNITY)
Admission: RE | Disposition: A | Discharge status: Home / Self Care | Payer: Self-pay | Source: Home / Self Care | Attending: Internal Medicine

## 2021-07-03 ENCOUNTER — Ambulatory Visit (HOSPITAL_COMMUNITY)
Admission: RE | Admit: 2021-07-03 | Discharge: 2021-07-03 | Disposition: A | Discharge status: Home / Self Care | Payer: BC Managed Care – PPO | Attending: Internal Medicine | Admitting: Internal Medicine

## 2021-07-03 DIAGNOSIS — F419 Anxiety disorder, unspecified: Secondary | ICD-10-CM | POA: Insufficient documentation

## 2021-07-03 DIAGNOSIS — D123 Benign neoplasm of transverse colon: Secondary | ICD-10-CM | POA: Insufficient documentation

## 2021-07-03 DIAGNOSIS — D122 Benign neoplasm of ascending colon: Secondary | ICD-10-CM | POA: Diagnosis not present

## 2021-07-03 DIAGNOSIS — K644 Residual hemorrhoidal skin tags: Secondary | ICD-10-CM | POA: Insufficient documentation

## 2021-07-03 DIAGNOSIS — Z1211 Encounter for screening for malignant neoplasm of colon: Secondary | ICD-10-CM | POA: Diagnosis not present

## 2021-07-03 DIAGNOSIS — D127 Benign neoplasm of rectosigmoid junction: Secondary | ICD-10-CM | POA: Diagnosis not present

## 2021-07-03 DIAGNOSIS — E785 Hyperlipidemia, unspecified: Secondary | ICD-10-CM | POA: Diagnosis not present

## 2021-07-03 DIAGNOSIS — K635 Polyp of colon: Secondary | ICD-10-CM | POA: Diagnosis not present

## 2021-07-03 DIAGNOSIS — Z87891 Personal history of nicotine dependence: Secondary | ICD-10-CM | POA: Insufficient documentation

## 2021-07-03 DIAGNOSIS — I10 Essential (primary) hypertension: Secondary | ICD-10-CM | POA: Diagnosis not present

## 2021-07-03 DIAGNOSIS — Z79899 Other long term (current) drug therapy: Secondary | ICD-10-CM | POA: Diagnosis not present

## 2021-07-03 DIAGNOSIS — D125 Benign neoplasm of sigmoid colon: Secondary | ICD-10-CM | POA: Insufficient documentation

## 2021-07-03 HISTORY — PX: BIOPSY: SHX5522

## 2021-07-03 HISTORY — PX: COLONOSCOPY WITH PROPOFOL: SHX5780

## 2021-07-03 HISTORY — PX: POLYPECTOMY: SHX5525

## 2021-07-03 LAB — HM COLONOSCOPY

## 2021-07-03 SURGERY — COLONOSCOPY WITH PROPOFOL
Anesthesia: General

## 2021-07-03 MED ORDER — GABAPENTIN 300 MG PO CAPS: 300.0000 mg | ORAL_CAPSULE | ORAL | Status: DC

## 2021-07-03 MED ORDER — PHENYLEPHRINE HCL (PRESSORS) 10 MG/ML IV SOLN
INTRAVENOUS | Status: DC | PRN
  Administered 2021-07-03 (×3): 100 ug via INTRAVENOUS

## 2021-07-03 MED ORDER — PROPOFOL 10 MG/ML IV BOLUS
INTRAVENOUS | Status: DC | PRN
  Administered 2021-07-03: 50 mg via INTRAVENOUS
  Administered 2021-07-03: 100 mg via INTRAVENOUS

## 2021-07-03 MED ORDER — PROPOFOL 500 MG/50ML IV EMUL
INTRAVENOUS | Status: DC | PRN
  Administered 2021-07-03: 150 ug/kg/min via INTRAVENOUS

## 2021-07-03 MED ORDER — LIDOCAINE HCL (CARDIAC) PF 100 MG/5ML IV SOSY
PREFILLED_SYRINGE | INTRAVENOUS | Status: DC | PRN
  Administered 2021-07-03: 50 mg via INTRAVENOUS

## 2021-07-03 MED ORDER — LACTATED RINGERS IV SOLN: INTRAVENOUS | Status: DC

## 2021-07-03 NOTE — Anesthesia Preprocedure Evaluation (Signed)
Anesthesia Evaluation  Patient identified by MRN, date of birth, ID band Patient awake    Reviewed: Allergy & Precautions, H&P , NPO status , Patient's Chart, lab work & pertinent test results, reviewed documented beta blocker date and time   Airway Mallampati: II  TM Distance: >3 FB Neck ROM: full    Dental no notable dental hx.    Pulmonary neg pulmonary ROS,    Pulmonary exam normal breath sounds clear to auscultation       Cardiovascular Exercise Tolerance: Good negative cardio ROS   Rhythm:regular Rate:Normal     Neuro/Psych PSYCHIATRIC DISORDERS Anxiety negative neurological ROS  negative psych ROS   GI/Hepatic negative GI ROS, Neg liver ROS,   Endo/Other  negative endocrine ROS  Renal/GU negative Renal ROS  negative genitourinary   Musculoskeletal   Abdominal   Peds  Hematology negative hematology ROS (+)   Anesthesia Other Findings   Reproductive/Obstetrics negative OB ROS                             Anesthesia Physical Anesthesia Plan  ASA: 2  Anesthesia Plan: General   Post-op Pain Management:    Induction:   PONV Risk Score and Plan: Propofol infusion  Airway Management Planned:   Additional Equipment:   Intra-op Plan:   Post-operative Plan:   Informed Consent: I have reviewed the patients History and Physical, chart, labs and discussed the procedure including the risks, benefits and alternatives for the proposed anesthesia with the patient or authorized representative who has indicated his/her understanding and acceptance.     Dental Advisory Given  Plan Discussed with: CRNA  Anesthesia Plan Comments:         Anesthesia Quick Evaluation

## 2021-07-03 NOTE — Op Note (Signed)
[facility] Patient Name: Amy Ashley Procedure Date: 07/03/2021 10:31 AM MRN: [id number] Date of Birth: 03/16/1956 Attending MD: Hildred Laser , MD CSN: [id number] Age: 66 Admit Type: Outpatient Procedure:                Colonoscopy Indications:              Screening for colorectal malignant neoplasm Providers:                Hildred Laser, MD, Rosina Lowenstein, RN, Hughie Closs,                            RN, Randa Spike, Technician Referring MD:             Stoney Bang , MD Medicines:                Propofol per Anesthesia Complications:            No immediate complications. Estimated Blood Loss:     Estimated blood loss was minimal. Procedure:                Pre-Anesthesia Assessment:                           - Prior to the procedure, a History and Physical                            was performed, and patient medications and                            allergies were reviewed. The patient's tolerance of                            previous anesthesia was also reviewed. The risks                            and benefits of the procedure and the sedation                            options and risks were discussed with the patient.                            All questions were answered, and informed consent                            was obtained. Prior Anticoagulants: The patient has                            taken no previous anticoagulant or antiplatelet                            agents. ASA Grade Assessment: II - A patient with                            mild systemic disease. After reviewing the risks  and benefits, the patient was deemed in                            satisfactory condition to undergo the procedure.                           After obtaining informed consent, the colonoscope                            was passed under direct vision. Throughout the                            procedure, the patient's blood pressure, pulse, and                             oxygen saturations were monitored continuously. The                            PCF-HQ190L ([id number]) scope was introduced through                            the anus and advanced to the the cecum, identified                            by appendiceal orifice and ileocecal valve. The                            colonoscopy was performed without difficulty. The                            patient tolerated the procedure well. The quality                            of the bowel preparation was good. The ileocecal                            valve, appendiceal orifice, and rectum were                            photographed. Scope In: 10:59:14 AM Scope Out: 11:34:47 AM Scope Withdrawal Time: 0 hours 26 minutes 53 seconds  Total Procedure Duration: 0 hours 35 minutes 33 seconds  Findings:      The perianal and digital rectal examinations were normal.      Seven polyps were found in the recto-sigmoid colon, splenic flexure,       transverse colon, hepatic flexure and ascending colon. The polyps were 4       to 7 mm in size. These polyps were removed with a cold snare. Resection       and retrieval were complete. The pathology specimen was placed into       Bottle Number 1.      Two polyps were found in the splenic flexure. The polyps were diminutive       in size. These were biopsied with a cold forceps for histology. The  pathology specimen was placed into Bottle Number 2.      A 7 mm polyp was found in the mid sigmoid colon. The polyp was flat. The       polyp was removed with a cold snare. Resection and retrieval were       complete. The pathology specimen was placed into Bottle Number 3.      External hemorrhoids were found during retroflexion. The hemorrhoids       were small. Impression:               - Seven 4 to 7 mm polyps at the recto-sigmoid                            colon, at the splenic flexure, in the transverse                            colon, at the  hepatic flexure and in the ascending                            colon, removed with a cold snare. Resected and                            retrieved.                           - Two diminutive polyps at the splenic flexure.                            Biopsied.                           - One 7 mm polyp in the mid sigmoid colon, removed                            with a cold snare. Resected and retrieved.                           - External hemorrhoids. Moderate Sedation:      Per Anesthesia Care Recommendation:           - Patient has a contact number available for                            emergencies. The signs and symptoms of potential                            delayed complications were discussed with the                            patient. Return to normal activities tomorrow.                            Written discharge instructions were provided to the                            patient.                           -  Resume previous diet today.                           - Continue present medications.                           - No aspirin, ibuprofen, naproxen, or other                            non-steroidal anti-inflammatory drugs for 1 day.                           - Await pathology results.                           - Repeat colonoscopy is recommended. The                            colonoscopy date will be determined after pathology                            results from today's exam become available for                            review. Procedure Code(s):        --- Professional ---                           [phone], Colonoscopy, flexible; with removal of                            tumor(s), polyp(s), or other lesion(s) by snare                            technique                           45380, 21, Colonoscopy, flexible; with biopsy,                            single or multiple Diagnosis Code(s):        --- Professional ---                           K63.5, Polyp of  colon                           Z12.11, Encounter for screening for malignant                            neoplasm of colon                           K64.4, Residual hemorrhoidal skin tags CPT copyright 2019 [employer]. All rights reserved. The codes documented in this report are preliminary and upon coder review may  be revised to meet current compliance requirements. Hildred Laser, MD Hildred Laser, MD 07/03/2021 11:49:28  AM This report has been signed electronically. Number of Addenda: 0

## 2021-07-03 NOTE — H&P (Signed)
Amy Ashley is an 66 y.o. female.   Chief Complaint: Patient is here for colonoscopy. HPI: Patient is 66 year old Caucasian female who is here for screening colonoscopy.  Last exam was normal 10 years ago.  She denies abdominal pain change in bowel habits.  She has noted occasional hematochezia with her bowel movement which she feels is due to irritation.  She has not had frank rectal bleeding. She does not take aspirin. Family history is negative for CRC.  Past medical history  Hyperlipidemia Anxiety Hypertension Vaginal hysterectomy in December 2003 for menometrorrhagia Screening colonoscopy 10 years ago.   History reviewed. No pertinent surgical history.  History reviewed. No pertinent family history. Social History:  reports that she quit smoking about 4 years ago. Her smoking use included cigarettes. She has never used smokeless tobacco. No history on file for alcohol use and drug use.  Allergies: Not on File  Medications Prior to Admission  Medication Sig Dispense Refill   atorvastatin (LIPITOR) 40 MG tablet Take 40 mg by mouth daily.     diazepam (VALIUM) 10 MG tablet Take 10 mg by mouth at bedtime.     metoprolol succinate (TOPROL-XL) 100 MG 24 hr tablet Take 100 mg by mouth daily.     polyethylene glycol-electrolytes (TRILYTE) 420 g solution Take 4,000 mLs by mouth as directed. 4000 mL 0   potassium chloride (KLOR-CON) 10 MEQ tablet Take 10 mEq by mouth daily.     RESTASIS 0.05 % ophthalmic emulsion Place 1 drop into both eyes 2 (two) times daily.     triamterene-hydrochlorothiazide (MAXZIDE-25) 37.5-25 MG tablet Take 1 tablet by mouth daily.      No results found for this or any previous visit (from the past 48 hour(s)). No results found.  Review of Systems  Blood pressure 129/80, pulse 69, temperature 98.2 F (36.8 C), temperature source Oral, resp. rate 14, height 5\' 6"  (1.676 m), weight 82.6 kg, SpO2 97 %. Physical Exam HENT:     Mouth/Throat:     Mouth:  Mucous membranes are moist.     Pharynx: Oropharynx is clear.  Eyes:     General: No scleral icterus.    Conjunctiva/sclera: Conjunctivae normal.  Cardiovascular:     Rate and Rhythm: Normal rate and regular rhythm.     Heart sounds: Normal heart sounds. No murmur heard. Pulmonary:     Effort: Pulmonary effort is normal.     Breath sounds: Normal breath sounds.  Abdominal:     General: There is no distension.     Palpations: Abdomen is soft. There is no mass.     Tenderness: There is no abdominal tenderness.  Musculoskeletal:     Cervical back: Neck supple.  Lymphadenopathy:     Cervical: No cervical adenopathy.  Neurological:     Mental Status: She is alert.     Assessment/Plan  Average risk screening colonoscopy  Hildred Laser, MD 07/03/2021, 10:50 AM

## 2021-07-03 NOTE — Transfer of Care (Signed)
Immediate Anesthesia Transfer of Care Note  Patient: Amy Ashley  Procedure(s) Performed: COLONOSCOPY WITH PROPOFOL POLYPECTOMY BIOPSY  Patient Location: PACU  Anesthesia Type:General  Level of Consciousness: awake, alert , oriented and patient cooperative  Airway & Oxygen Therapy: Patient Spontanous Breathing  Post-op Assessment: Report given to RN, Post -op Vital signs reviewed and stable and Patient moving all extremities X 4  Post vital signs: Reviewed and stable  Last Vitals:  Vitals Value Taken Time  BP 111/69 07/03/21 1143  Temp 36.5 C 07/03/21 1141  Pulse 75 07/03/21 1141  Resp 26 07/03/21 1141  SpO2 98 % 07/03/21 1141    Last Pain:  Vitals:   07/03/21 1141  TempSrc: Oral  PainSc: 0-No pain         Complications: No notable events documented.

## 2021-07-03 NOTE — Discharge Instructions (Signed)
No aspirin or NSAIDs for 24 hours Resume usual medications and diet as before. No driving for 24 hours. Physician will call with biopsy results and further recommendations.

## 2021-07-03 NOTE — Anesthesia Postprocedure Evaluation (Signed)
Anesthesia Post Note  Patient: Amy Ashley  Procedure(s) Performed: COLONOSCOPY WITH PROPOFOL POLYPECTOMY BIOPSY  Patient location during evaluation: Phase II Anesthesia Type: General Level of consciousness: awake Pain management: pain level controlled Vital Signs Assessment: post-procedure vital signs reviewed and stable Respiratory status: spontaneous breathing and respiratory function stable Cardiovascular status: blood pressure returned to baseline and stable Postop Assessment: no headache and no apparent nausea or vomiting Anesthetic complications: no Comments: Late entry   No notable events documented.   Last Vitals:  Vitals:   07/03/21 1141 07/03/21 1143  BP:  111/69  Pulse: 75   Resp: (!) 26   Temp: 36.5 C   SpO2: 98%     Last Pain:  Vitals:   07/03/21 1141  TempSrc: Oral  PainSc: 0-No pain                 Louann Sjogren

## 2021-07-04 LAB — SURGICAL PATHOLOGY

## 2021-07-05 ENCOUNTER — Encounter (HOSPITAL_COMMUNITY): Payer: Self-pay | Admitting: Internal Medicine

## 2021-08-05 DIAGNOSIS — Z1231 Encounter for screening mammogram for malignant neoplasm of breast: Secondary | ICD-10-CM | POA: Diagnosis not present

## 2021-08-21 DIAGNOSIS — E7849 Other hyperlipidemia: Secondary | ICD-10-CM | POA: Diagnosis not present

## 2021-08-21 DIAGNOSIS — F419 Anxiety disorder, unspecified: Secondary | ICD-10-CM | POA: Diagnosis not present

## 2021-08-21 DIAGNOSIS — E669 Obesity, unspecified: Secondary | ICD-10-CM | POA: Diagnosis not present

## 2021-08-21 DIAGNOSIS — I10 Essential (primary) hypertension: Secondary | ICD-10-CM | POA: Diagnosis not present

## 2021-11-19 DIAGNOSIS — E669 Obesity, unspecified: Secondary | ICD-10-CM | POA: Diagnosis not present

## 2021-11-19 DIAGNOSIS — F419 Anxiety disorder, unspecified: Secondary | ICD-10-CM | POA: Diagnosis not present

## 2021-11-19 DIAGNOSIS — I10 Essential (primary) hypertension: Secondary | ICD-10-CM | POA: Diagnosis not present

## 2021-11-19 DIAGNOSIS — E7849 Other hyperlipidemia: Secondary | ICD-10-CM | POA: Diagnosis not present

## 2021-11-28 DIAGNOSIS — M810 Age-related osteoporosis without current pathological fracture: Secondary | ICD-10-CM | POA: Diagnosis not present

## 2021-11-28 DIAGNOSIS — Z780 Asymptomatic menopausal state: Secondary | ICD-10-CM | POA: Diagnosis not present

## 2022-02-25 DIAGNOSIS — F419 Anxiety disorder, unspecified: Secondary | ICD-10-CM | POA: Diagnosis not present

## 2022-02-25 DIAGNOSIS — I10 Essential (primary) hypertension: Secondary | ICD-10-CM | POA: Diagnosis not present

## 2022-02-25 DIAGNOSIS — E7849 Other hyperlipidemia: Secondary | ICD-10-CM | POA: Diagnosis not present

## 2022-02-25 DIAGNOSIS — E669 Obesity, unspecified: Secondary | ICD-10-CM | POA: Diagnosis not present

## 2024-05-26 ENCOUNTER — Encounter (INDEPENDENT_AMBULATORY_CARE_PROVIDER_SITE_OTHER): Payer: Self-pay | Admitting: *Deleted

## 2024-06-22 ENCOUNTER — Telehealth (INDEPENDENT_AMBULATORY_CARE_PROVIDER_SITE_OTHER): Payer: Self-pay

## 2024-06-22 NOTE — Telephone Encounter (Signed)
 Patient called asking about getting scheduled for her procedure. I ATC patient back but it said my call could not be completed at this time (x2).   Received questionnaire, Dr. Cinderella will have to advise on scheduling when he is back in the office.

## 2024-06-28 ENCOUNTER — Telehealth (INDEPENDENT_AMBULATORY_CARE_PROVIDER_SITE_OTHER): Payer: Self-pay

## 2024-06-28 NOTE — Telephone Encounter (Signed)
 Who is your primary care physician: Dr. Orpha  Reasons for the colonoscopy: history of colon polyps, family history colon cancer, screening  Have you had a colonoscopy before?  yes  Do you have family history of colon cancer? Yes, sister  Previous colonoscopy with polyps removed? yes  Do you have a history colorectal cancer?   no  Are you diabetic? If yes, Type 1 or Type 2?    no  Do you have a prosthetic or mechanical heart valve? no  Do you have a pacemaker/defibrillator?   no  Have you had endocarditis/atrial fibrillation? no  Have you had joint replacement within the last 12 months?  no  Do you tend to be constipated or have to use laxatives? no  Do you have any history of drugs or alcohol?  no  Do you use supplemental oxygen?  no  Have you had a stroke or heart attack within the last 6 months? no  Do you take weight loss medication?  yes  For female patients: have you had a hysterectomy?  yes                                     are you post menopausal?       no                                            do you still have your menstrual cycle? no      Do you take any blood-thinning medications such as: (aspirin, warfarin, Plavix, Aggrenox)  no  If yes we need the name, milligram, dosage and who is prescribing doctor   Current Outpatient Medications  Medication Sig Dispense Refill   atorvastatin (LIPITOR) 40 MG tablet Take 40 mg by mouth daily.     metoprolol succinate (TOPROL-XL) 100 MG 24 hr tablet Take 100 mg by mouth daily.     olmesartan (BENICAR) 20 MG tablet Take 20 mg by mouth daily.     potassium chloride (KLOR-CON) 10 MEQ tablet Take 10 mEq by mouth daily.     tirzepatide (MOUNJARO) 2.5 MG/0.5ML Pen Inject 2.5 mg into the skin once a week.     No current facility-administered medications for this visit.    Allergies[1]  Pharmacy: [facility]  Primary Insurance Name: [facility]  Best number where you can be reached: [phone]     [1] Not on  File

## 2024-06-29 NOTE — Telephone Encounter (Signed)
 Ok to schedule.  Room Any  Hold Mounjaro as per protocol   Thanks,  Rockford Leinen Faizan Sten Dematteo, MD Gastroenterology and Hepatology [facility]

## 2024-06-30 ENCOUNTER — Encounter: Payer: Self-pay | Admitting: *Deleted

## 2024-06-30 MED ORDER — PEG 3350-KCL-NA BICARB-NACL 420 G PO SOLR: 4000.0000 mL | Freq: Once | ORAL | 0 refills | Status: AC

## 2024-06-30 NOTE — Telephone Encounter (Signed)
 Pt has been scheduled for 07/26/24. Instructions mailed and prep sent to pharmacy.

## 2024-06-30 NOTE — Telephone Encounter (Signed)
 LMOVM to call back

## 2024-06-30 NOTE — Addendum Note (Signed)
 Addended by: GAYLENE MADELIN CROME on: 06/30/2024 04:49 PM   Modules accepted: Orders

## 2024-07-01 NOTE — Telephone Encounter (Signed)
 Questionnaire from recall, no referral needed

## 2024-07-26 ENCOUNTER — Ambulatory Visit (HOSPITAL_COMMUNITY): Admit: 2024-07-26 | Admitting: Gastroenterology

## 2024-07-26 ENCOUNTER — Encounter (HOSPITAL_COMMUNITY): Payer: Self-pay
